# Patient Record
Sex: Female | Born: 1955 | Race: White | Hispanic: No | Marital: Married | Smoking: Current some day smoker
Health system: Southern US, Community
[De-identification: ages and names within clinical notes are randomized; demographics above are authoritative.]

## PROBLEM LIST (undated history)

## (undated) DIAGNOSIS — R000 Tachycardia, unspecified: Secondary | ICD-10-CM

## (undated) DIAGNOSIS — G4730 Sleep apnea, unspecified: Secondary | ICD-10-CM

## (undated) DIAGNOSIS — J449 Chronic obstructive pulmonary disease, unspecified: Secondary | ICD-10-CM

## (undated) DIAGNOSIS — E0500 Thyrotoxicosis with diffuse goiter without thyrotoxic crisis or storm: Secondary | ICD-10-CM

## (undated) DIAGNOSIS — I10 Essential (primary) hypertension: Secondary | ICD-10-CM

## (undated) DIAGNOSIS — K219 Gastro-esophageal reflux disease without esophagitis: Secondary | ICD-10-CM

## (undated) HISTORY — PX: FOOT SURGERY: SHX648

## (undated) HISTORY — PX: BACK SURGERY: SHX140

## (undated) HISTORY — PX: ABDOMINAL HYSTERECTOMY: SHX81

## (undated) HISTORY — PX: THYROIDECTOMY: SHX17

## (undated) HISTORY — PX: TUBAL LIGATION: SHX77

## (undated) HISTORY — PX: COLON RESECTION: SHX5231

---

## 2006-03-26 ENCOUNTER — Ambulatory Visit: Payer: Self-pay | Admitting: Physician Assistant

## 2006-05-18 ENCOUNTER — Other Ambulatory Visit: Payer: Self-pay

## 2006-05-24 ENCOUNTER — Inpatient Hospital Stay: Payer: Self-pay | Admitting: Unknown Physician Specialty

## 2009-12-02 ENCOUNTER — Emergency Department: Payer: Self-pay | Admitting: Emergency Medicine

## 2010-11-12 ENCOUNTER — Ambulatory Visit: Payer: Self-pay | Admitting: Family Medicine

## 2012-05-18 IMAGING — NM NM THYROID IMAGING W/ UPTAKE SINGLE (24 HR)
1 series · 3 of 3 positions shown · non-contrast
Comparison: none

REASON FOR EXAM: Low TSH Eval Graves Disease
COMMENTS:

[Series 1000: (id) thyroid scan · 2.40mm/px · 3 of 3 slices shown]
[im 1/3]
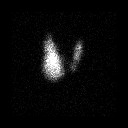
[im 2/3]
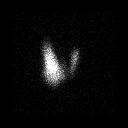
[im 3/3]
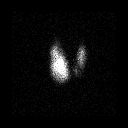

[3 of 3 positions shown; findings below may reference images not displayed]

PROCEDURE:     KNM - KNM THYROID 3-P0A 24HR [DATE]  [DATE]

RESULT:     Following oral administration 152 uCi radioactive Iodine 3-P0A,
the 5 hour thyroid uptake measures 21.4% and the 23 hour uptake measures
42.8%. These values are in the hyperthyroid range.

Thyroid scan was performed in the anterior and both oblique views. The
thyroid lobes are asymmetrical with the left lobe appearing small. No hot or
cold nodules are identified in either lobe.
IMPRESSION: 1. Thyroid uptake values are in the hyperthyroid range as noted above.
2. The thyroid lobes are asymmetrical in size with the left lobe appearing
small and atrophic as compared to the right.
3. No hot or cold nodules are identified.

## 2017-03-08 ENCOUNTER — Ambulatory Visit: Payer: Self-pay | Attending: Oncology | Admitting: *Deleted

## 2017-03-08 ENCOUNTER — Encounter: Payer: Self-pay | Admitting: *Deleted

## 2017-03-08 ENCOUNTER — Ambulatory Visit
Admission: RE | Admit: 2017-03-08 | Discharge: 2017-03-08 | Disposition: A | Discharge status: Home / Self Care | Payer: Self-pay | Source: Ambulatory Visit | Attending: Oncology | Admitting: Oncology

## 2017-03-08 VITALS — BP 147/91 | HR 60 | Temp 98.6°F | Ht 66.0 in | Wt 193.0 lb

## 2017-03-08 DIAGNOSIS — Z0000 Encounter for general adult medical examination without abnormal findings: Secondary | ICD-10-CM

## 2017-03-08 NOTE — Patient Instructions (Signed)

## 2017-03-08 NOTE — Progress Notes (Signed)
Subjective:     Patient ID: Wanda Morgan, female   DOB: 10/23/1955, 61 y.o.   MRN: [id number]  HPI   Review of Systems     Objective:   Physical Exam  Pulmonary/Chest: Right breast exhibits no inverted nipple, no mass, no nipple discharge, no skin change and no tenderness. Left breast exhibits no inverted nipple, no mass, no nipple discharge, no skin change and no tenderness. Breasts are symmetrical.       Assessment:     61 year old White female referred to [facility] by Rolm GalaHeidi Grandis, MD from [facility].  Patient states she has not had a mammogram in 40 years.  States she had one at age 61 for non-diagnostic purposes.  Clinical breast exam unremarkable.  Taught self breast awareness.  History of a total hysterectomy at age 61 for heavy bleeding.  Blood pressure elevated at 147/91.  She is to recheck her blood pressure at [facility] or [facility], and if remains higher than 140/90 she is to follow-up with her primary care provider.  Patient states "I have agoraphobia, and I have anxiety attacks."  Hand out on hypertention given to patient.  Patient has been screened for eligibility.  She does not have any insurance, Medicare or Medicaid.  She also meets financial eligibility.  Hand-out given on the Affordable Care Act.    Plan:     Screening mammogram ordered.  Will follow-up per [facility] protocol.

## 2017-03-09 ENCOUNTER — Encounter: Payer: Self-pay | Admitting: *Deleted

## 2017-03-09 NOTE — Progress Notes (Signed)
Letter mailed from the [facility] to inform patient of her normal mammogram results.  Patient is to follow-up with annual screening in one year.  HSIS to Christy. 

## 2020-09-30 ENCOUNTER — Encounter: Payer: Self-pay | Admitting: Ophthalmology

## 2020-10-03 ENCOUNTER — Other Ambulatory Visit
Admission: RE | Admit: 2020-10-03 | Discharge: 2020-10-03 | Disposition: A | Discharge status: Home / Self Care | Payer: Medicare (Managed Care) | Source: Ambulatory Visit | Attending: Ophthalmology | Admitting: Ophthalmology

## 2020-10-03 ENCOUNTER — Other Ambulatory Visit: Payer: Self-pay

## 2020-10-03 DIAGNOSIS — Z20822 Contact with and (suspected) exposure to covid-19: Secondary | ICD-10-CM | POA: Insufficient documentation

## 2020-10-03 DIAGNOSIS — Z01812 Encounter for preprocedural laboratory examination: Secondary | ICD-10-CM | POA: Diagnosis present

## 2020-10-03 NOTE — Discharge Instructions (Signed)

## 2020-10-04 LAB — SARS CORONAVIRUS 2 (TAT 6-24 HRS): SARS Coronavirus 2: NEGATIVE

## 2020-10-07 ENCOUNTER — Encounter: Payer: Self-pay | Admitting: Ophthalmology

## 2020-10-07 ENCOUNTER — Other Ambulatory Visit: Payer: Self-pay

## 2020-10-07 ENCOUNTER — Encounter
Admission: RE | Disposition: A | Discharge status: Home / Self Care | Payer: Self-pay | Source: Ambulatory Visit | Attending: Ophthalmology

## 2020-10-07 ENCOUNTER — Ambulatory Visit
Admission: RE | Admit: 2020-10-07 | Discharge: 2020-10-07 | Disposition: A | Discharge status: Home / Self Care | Payer: Medicare (Managed Care) | Source: Ambulatory Visit | Attending: Ophthalmology | Admitting: Ophthalmology

## 2020-10-07 ENCOUNTER — Ambulatory Visit: Payer: Medicare (Managed Care) | Admitting: Anesthesiology

## 2020-10-07 DIAGNOSIS — F17200 Nicotine dependence, unspecified, uncomplicated: Secondary | ICD-10-CM | POA: Diagnosis not present

## 2020-10-07 DIAGNOSIS — H2512 Age-related nuclear cataract, left eye: Secondary | ICD-10-CM | POA: Diagnosis not present

## 2020-10-07 DIAGNOSIS — Z888 Allergy status to other drugs, medicaments and biological substances status: Secondary | ICD-10-CM | POA: Insufficient documentation

## 2020-10-07 DIAGNOSIS — Z79890 Hormone replacement therapy (postmenopausal): Secondary | ICD-10-CM | POA: Insufficient documentation

## 2020-10-07 DIAGNOSIS — Z79899 Other long term (current) drug therapy: Secondary | ICD-10-CM | POA: Insufficient documentation

## 2020-10-07 HISTORY — PX: CATARACT EXTRACTION W/PHACO: SHX586

## 2020-10-07 HISTORY — DX: Tachycardia, unspecified: R00.0

## 2020-10-07 HISTORY — DX: Essential (primary) hypertension: I10

## 2020-10-07 HISTORY — DX: Sleep apnea, unspecified: G47.30

## 2020-10-07 HISTORY — DX: Gastro-esophageal reflux disease without esophagitis: K21.9

## 2020-10-07 HISTORY — DX: Thyrotoxicosis with diffuse goiter without thyrotoxic crisis or storm: E05.00

## 2020-10-07 HISTORY — DX: Chronic obstructive pulmonary disease, unspecified: J44.9

## 2020-10-07 SURGERY — PHACOEMULSIFICATION, CATARACT, WITH IOL INSERTION
Anesthesia: Monitor Anesthesia Care | Site: Eye | Laterality: Left

## 2020-10-07 MED ORDER — MOXIFLOXACIN HCL 0.5 % OP SOLN
OPHTHALMIC | Status: DC | PRN
  Administered 2020-10-07: 0.2 mL via OPHTHALMIC

## 2020-10-07 MED ORDER — EPINEPHRINE PF 1 MG/ML IJ SOLN
INTRAOCULAR | Status: DC | PRN
  Administered 2020-10-07: 55 mL via OPHTHALMIC

## 2020-10-07 MED ORDER — ACETAMINOPHEN 325 MG PO TABS: 325.0000 mg | ORAL_TABLET | Freq: Once | ORAL | Status: DC

## 2020-10-07 MED ORDER — LACTATED RINGERS IV SOLN: INTRAVENOUS | Status: DC

## 2020-10-07 MED ORDER — SODIUM HYALURONATE 23 MG/ML IO SOLN
INTRAOCULAR | Status: DC | PRN
  Administered 2020-10-07: 0.6 mL via INTRAOCULAR

## 2020-10-07 MED ORDER — TETRACAINE HCL 0.5 % OP SOLN
1.0000 [drp] | OPHTHALMIC | Status: DC | PRN
  Administered 2020-10-07 (×3): 1 [drp] via OPHTHALMIC

## 2020-10-07 MED ORDER — ACETAMINOPHEN 160 MG/5ML PO SOLN: 325.0000 mg | Freq: Once | ORAL | Status: DC

## 2020-10-07 MED ORDER — MIDAZOLAM HCL 2 MG/2ML IJ SOLN
INTRAMUSCULAR | Status: DC | PRN
  Administered 2020-10-07: 2 mg via INTRAVENOUS

## 2020-10-07 MED ORDER — ARMC OPHTHALMIC DILATING DROPS
1.0000 "application " | OPHTHALMIC | Status: DC | PRN
  Administered 2020-10-07 (×3): 1 via OPHTHALMIC

## 2020-10-07 MED ORDER — LIDOCAINE HCL (PF) 2 % IJ SOLN
INTRAOCULAR | Status: DC | PRN
  Administered 2020-10-07: 1 mL via INTRAOCULAR

## 2020-10-07 MED ORDER — SODIUM HYALURONATE 10 MG/ML IO SOLN
INTRAOCULAR | Status: DC | PRN
  Administered 2020-10-07: 0.55 mL via INTRAOCULAR

## 2020-10-07 MED ORDER — FENTANYL CITRATE (PF) 100 MCG/2ML IJ SOLN
INTRAMUSCULAR | Status: DC | PRN
  Administered 2020-10-07: 100 ug via INTRAVENOUS

## 2020-10-07 SURGICAL SUPPLY — 19 items
CANNULA ANT/CHMB 27G (MISCELLANEOUS) ×2 IMPLANT
CANNULA ANT/CHMB 27GA (MISCELLANEOUS) ×4 IMPLANT
DISSECTOR HYDRO NUCLEUS 50X22 (MISCELLANEOUS) ×2 IMPLANT
GLOVE SURG LX 7.5 STRW (GLOVE) ×1
GLOVE SURG LX STRL 7.5 STRW (GLOVE) ×1 IMPLANT
GLOVE SURG SYN 8.5  E (GLOVE) ×1
GLOVE SURG SYN 8.5 E (GLOVE) ×1 IMPLANT
GLOVE SURG SYN 8.5 PF PI (GLOVE) ×1 IMPLANT
GOWN STRL REUS W/ TWL LRG LVL3 (GOWN DISPOSABLE) ×2 IMPLANT
GOWN STRL REUS W/TWL LRG LVL3 (GOWN DISPOSABLE) ×4
LENS IOL TECNIS EYHANCE 14.0 (Intraocular Lens) ×1 IMPLANT
MARKER SKIN DUAL TIP RULER LAB (MISCELLANEOUS) ×2 IMPLANT
PACK DR. KING ARMS (PACKS) ×2 IMPLANT
PACK EYE AFTER SURG (MISCELLANEOUS) ×2 IMPLANT
PACK OPTHALMIC (MISCELLANEOUS) ×2 IMPLANT
SYR 3ML LL SCALE MARK (SYRINGE) ×2 IMPLANT
SYR TB 1ML LUER SLIP (SYRINGE) ×2 IMPLANT
WATER STERILE IRR 250ML POUR (IV SOLUTION) ×2 IMPLANT
WIPE NON LINTING 3.25X3.25 (MISCELLANEOUS) ×2 IMPLANT

## 2020-10-07 NOTE — Anesthesia Preprocedure Evaluation (Signed)
Anesthesia Evaluation  Patient identified by MRN, date of birth, ID band Patient awake    Reviewed: Allergy & Precautions, H&P , NPO status , Patient's Chart, lab work & pertinent test results  Airway Mallampati: II  TM Distance: >3 FB Neck ROM: full    Dental no notable dental hx.    Pulmonary sleep apnea , COPD, Current Smoker and Patient abstained from smoking.,    Pulmonary exam normal breath sounds clear to auscultation       Cardiovascular hypertension, Normal cardiovascular exam Rhythm:regular Rate:Normal     Neuro/Psych Anxiety    GI/Hepatic GERD  ,  Endo/Other    Renal/GU      Musculoskeletal   Abdominal   Peds  Hematology   Anesthesia Other Findings   Reproductive/Obstetrics                             Anesthesia Physical Anesthesia Plan  ASA: III  Anesthesia Plan: MAC   Post-op Pain Management:    Induction:   PONV Risk Score and Plan: 2 and Treatment may vary due to age or medical condition, TIVA and Midazolam  Airway Management Planned:   Additional Equipment:   Intra-op Plan:   Post-operative Plan:   Informed Consent: I have reviewed the patients History and Physical, chart, labs and discussed the procedure including the risks, benefits and alternatives for the proposed anesthesia with the patient or authorized representative who has indicated his/her understanding and acceptance.     Dental Advisory Given  Plan Discussed with: CRNA  Anesthesia Plan Comments:         Anesthesia Quick Evaluation

## 2020-10-07 NOTE — Op Note (Signed)
OPERATIVE NOTE  RHEDA KASSAB [id number] 10/07/2020   PREOPERATIVE DIAGNOSIS:  Nuclear sclerotic cataract left eye.  H25.12   POSTOPERATIVE DIAGNOSIS:    Nuclear sclerotic cataract left eye.     PROCEDURE:  Phacoemusification with posterior chamber intraocular lens placement of the left eye   LENS:   Implant Name Type Inv. Item Serial No. Manufacturer Lot No. LRB No. Used Action  LENS IOL TECNIS EYHANCE 14.0 - [id number] Intraocular Lens LENS IOL TECNIS EYHANCE 14.0 [id number] JOHNSON   Left 1 Implanted      Procedure(s): CATARACT EXTRACTION PHACO AND INTRAOCULAR LENS PLACEMENT (IOC) LEFT 4.66 00:36.4 (Left)  DIB00 +14.0   ULTRASOUND TIME: 0 minutes 36 seconds.  CDE 4.66   SURGEON:  Willey Blade, MD, MPH   ANESTHESIA:  Topical with tetracaine drops augmented with 1% preservative-free intracameral lidocaine.  ESTIMATED BLOOD LOSS: <1 mL   COMPLICATIONS:  None.   DESCRIPTION OF PROCEDURE:  The patient was identified in the holding room and transported to the operating room and placed in the supine position under the operating microscope.  The left eye was identified as the operative eye and it was prepped and draped in the usual sterile ophthalmic fashion.   A 1.0 millimeter clear-corneal paracentesis was made at the 5:00 position. 0.5 ml of preservative-free 1% lidocaine with epinephrine was injected into the anterior chamber.  The anterior chamber was filled with Healon 5 viscoelastic.  A 2.4 millimeter keratome was used to make a near-clear corneal incision at the 2:00 position.  A curvilinear capsulorrhexis was made with a cystotome and capsulorrhexis forceps.  Balanced salt solution was used to hydrodissect and hydrodelineate the nucleus.   Phacoemulsification was then used in stop and chop fashion to remove the lens nucleus and epinucleus.  The remaining cortex was then removed using the irrigation and aspiration handpiece. Healon was then placed into the capsular bag to  distend it for lens placement.  A lens was then injected into the capsular bag.  The remaining viscoelastic was aspirated.   Wounds were hydrated with balanced salt solution.  The anterior chamber was inflated to a physiologic pressure with balanced salt solution.  Intracameral vigamox 0.1 mL undiltued was injected into the eye and a drop placed onto the ocular surface.  No wound leaks were noted.  The patient was taken to the recovery room in stable condition without complications of anesthesia or surgery  Willey Blade 10/07/2020, 10:03 AM

## 2020-10-07 NOTE — H&P (Signed)
[facility]   Primary Care Physician:  Rolm Gala, MD Ophthalmologist: Dr. Willey Blade  Pre-Procedure History & Physical: HPI:  Wanda Morgan is a 65 y.o. female here for cataract surgery.   Past Medical History:  Diagnosis Date  . COPD (chronic obstructive pulmonary disease) (HCC)   . GERD (gastroesophageal reflux disease)   . Graves disease   . Hypertension   . Sleep apnea   . Tachycardia     Past Surgical History:  Procedure Laterality Date  . ABDOMINAL HYSTERECTOMY    . BACK SURGERY    . COLON RESECTION    . FOOT SURGERY    . THYROIDECTOMY    . TUBAL LIGATION      Prior to Admission medications   Medication Sig Start Date End Date Taking? Authorizing Provider  amLODipine (NORVASC) 5 MG tablet Take 5 mg by mouth daily.   Yes [provider]  Ascorbic Acid (VITAMIN C) 100 MG tablet Take 100 mg by mouth daily.   Yes [provider]  Ginger, Zingiber officinalis, (GINGER ROOT PO) Take by mouth.   Yes [provider]  levothyroxine (SYNTHROID) 100 MCG tablet Take 100 mcg by mouth daily before breakfast.   Yes [provider]  losartan (COZAAR) 100 MG tablet Take 100 mg by mouth daily.   Yes [provider]  metoprolol succinate (TOPROL-XL) 50 MG 24 hr tablet Take 50 mg by mouth daily. Take with or immediately following a meal.   Yes [provider]  omeprazole (PRILOSEC) 40 MG capsule Take 40 mg by mouth daily.   Yes [provider]    Allergies as of 08/14/2020 - never reviewed  Allergen Reaction Noted  . Lisinopril Cough 02/08/2017    Family History  Problem Relation Age of Onset  . Breast cancer Neg Hx     Social History   Socioeconomic History  . Marital status: Married    Spouse name: Not on file  . Number of children: Not on file  . Years of education: Not on file  . Highest education level: Not on file  Occupational History  . Not on file  Tobacco Use  . Smoking status:  Current Some Day Smoker  . Smokeless tobacco: Not on file  Substance and Sexual Activity  . Alcohol use: Yes    Alcohol/week: 18.0 standard drinks    Types: 18 Cans of beer per week  . Drug use: Not on file  . Sexual activity: Not on file  Other Topics Concern  . Not on file  Social History Narrative  . Not on file   Social Determinants of Health   Financial Resource Strain: Not on file  Food Insecurity: Not on file  Transportation Needs: Not on file  Physical Activity: Not on file  Stress: Not on file  Social Connections: Not on file  Intimate Partner Violence: Not on file    Review of Systems: See HPI, otherwise negative ROS  Physical Exam: BP (!) 157/87   Pulse (!) 59   Temp (!) 97.4 F (36.3 C) (Temporal)   Resp 18   Ht 5' 4.5" (1.638 m)   Wt 82.2 kg   SpO2 98%   BMI 30.64 kg/m  General:   Alert,  pleasant and cooperative in NAD Head:  Normocephalic and atraumatic. Respiratory:  Normal work of breathing.  Impression/Plan: Wanda Morgan is here for cataract surgery.  Risks, benefits, limitations, and alternatives regarding cataract surgery have been reviewed with the patient.  Questions have been answered.  All parties agreeable.   Willey Blade, MD  10/07/2020, 9:36 AM

## 2020-10-07 NOTE — Anesthesia Postprocedure Evaluation (Signed)
Anesthesia Post Note  Patient: Wanda Morgan  Procedure(s) Performed: CATARACT EXTRACTION PHACO AND INTRAOCULAR LENS PLACEMENT (IOC) LEFT 4.66 00:36.4 (Left Eye)     Patient location during evaluation: PACU Anesthesia Type: MAC Level of consciousness: awake and alert and oriented Pain management: satisfactory to patient Vital Signs Assessment: post-procedure vital signs reviewed and stable Respiratory status: spontaneous breathing, nonlabored ventilation and respiratory function stable Cardiovascular status: blood pressure returned to baseline and stable Postop Assessment: Adequate PO intake and No signs of nausea or vomiting Anesthetic complications: no   No complications documented.  Raliegh Ip

## 2020-10-07 NOTE — Anesthesia Procedure Notes (Signed)
Procedure Name: MAC Date/Time: 10/07/2020 9:46 AM Performed by: Silvana Newness, CRNA Pre-anesthesia Checklist: Patient identified, Emergency Drugs available, Suction available, Patient being monitored and Timeout performed Patient Re-evaluated:Patient Re-evaluated prior to induction Oxygen Delivery Method: Nasal cannula Placement Confirmation: positive ETCO2

## 2020-10-07 NOTE — Transfer of Care (Signed)
Immediate Anesthesia Transfer of Care Note  Patient: Wanda Morgan  Procedure(s) Performed: CATARACT EXTRACTION PHACO AND INTRAOCULAR LENS PLACEMENT (IOC) LEFT 4.66 00:36.4 (Left Eye)  Patient Location: PACU  Anesthesia Type: MAC  Level of Consciousness: awake, alert  and patient cooperative  Airway and Oxygen Therapy: Patient Spontanous Breathing and Patient connected to supplemental oxygen  Post-op Assessment: Post-op Vital signs reviewed, Patient's Cardiovascular Status Stable, Respiratory Function Stable, Patent Airway and No signs of Nausea or vomiting  Post-op Vital Signs: Reviewed and stable  Complications: No complications documented.

## 2020-10-08 ENCOUNTER — Encounter: Payer: Self-pay | Admitting: Ophthalmology

## 2020-10-10 ENCOUNTER — Encounter: Payer: Self-pay | Admitting: Ophthalmology

## 2020-10-16 NOTE — Discharge Instructions (Signed)

## 2020-10-17 ENCOUNTER — Other Ambulatory Visit
Admission: RE | Admit: 2020-10-17 | Discharge: 2020-10-17 | Disposition: A | Discharge status: Home / Self Care | Payer: Medicare (Managed Care) | Source: Ambulatory Visit | Attending: Ophthalmology | Admitting: Ophthalmology

## 2020-10-17 ENCOUNTER — Other Ambulatory Visit: Payer: Self-pay

## 2020-10-17 DIAGNOSIS — Z20822 Contact with and (suspected) exposure to covid-19: Secondary | ICD-10-CM | POA: Diagnosis not present

## 2020-10-17 DIAGNOSIS — Z01812 Encounter for preprocedural laboratory examination: Secondary | ICD-10-CM | POA: Diagnosis present

## 2020-10-17 LAB — SARS CORONAVIRUS 2 (TAT 6-24 HRS): SARS Coronavirus 2: NEGATIVE

## 2020-10-21 ENCOUNTER — Ambulatory Visit
Admission: RE | Admit: 2020-10-21 | Discharge: 2020-10-21 | Disposition: A | Discharge status: Home / Self Care | Payer: Medicare (Managed Care) | Source: Ambulatory Visit | Attending: Ophthalmology | Admitting: Ophthalmology

## 2020-10-21 ENCOUNTER — Encounter
Admission: RE | Disposition: A | Discharge status: Home / Self Care | Payer: Self-pay | Source: Ambulatory Visit | Attending: Ophthalmology

## 2020-10-21 ENCOUNTER — Other Ambulatory Visit: Payer: Self-pay

## 2020-10-21 ENCOUNTER — Ambulatory Visit: Payer: Medicare (Managed Care) | Admitting: Anesthesiology

## 2020-10-21 ENCOUNTER — Encounter: Payer: Self-pay | Admitting: Ophthalmology

## 2020-10-21 DIAGNOSIS — Z888 Allergy status to other drugs, medicaments and biological substances status: Secondary | ICD-10-CM | POA: Insufficient documentation

## 2020-10-21 DIAGNOSIS — H2511 Age-related nuclear cataract, right eye: Secondary | ICD-10-CM | POA: Diagnosis not present

## 2020-10-21 DIAGNOSIS — Z79899 Other long term (current) drug therapy: Secondary | ICD-10-CM | POA: Insufficient documentation

## 2020-10-21 DIAGNOSIS — Z79890 Hormone replacement therapy (postmenopausal): Secondary | ICD-10-CM | POA: Diagnosis not present

## 2020-10-21 DIAGNOSIS — F17200 Nicotine dependence, unspecified, uncomplicated: Secondary | ICD-10-CM | POA: Diagnosis not present

## 2020-10-21 HISTORY — PX: CATARACT EXTRACTION W/PHACO: SHX586

## 2020-10-21 SURGERY — PHACOEMULSIFICATION, CATARACT, WITH IOL INSERTION
Anesthesia: Monitor Anesthesia Care | Site: Eye | Laterality: Right

## 2020-10-21 MED ORDER — SODIUM HYALURONATE 23 MG/ML IO SOLN
INTRAOCULAR | Status: DC | PRN
  Administered 2020-10-21: 0.6 mL via INTRAOCULAR

## 2020-10-21 MED ORDER — LIDOCAINE HCL (PF) 2 % IJ SOLN
INTRAOCULAR | Status: DC | PRN
  Administered 2020-10-21: 1 mL via INTRAOCULAR

## 2020-10-21 MED ORDER — EPINEPHRINE PF 1 MG/ML IJ SOLN
INTRAOCULAR | Status: DC | PRN
  Administered 2020-10-21: 64 mL via OPHTHALMIC

## 2020-10-21 MED ORDER — MOXIFLOXACIN HCL 0.5 % OP SOLN
OPHTHALMIC | Status: DC | PRN
  Administered 2020-10-21: 0.2 mL via OPHTHALMIC

## 2020-10-21 MED ORDER — TETRACAINE HCL 0.5 % OP SOLN
1.0000 [drp] | OPHTHALMIC | Status: DC | PRN
  Administered 2020-10-21 (×3): 1 [drp] via OPHTHALMIC

## 2020-10-21 MED ORDER — ACETAMINOPHEN 160 MG/5ML PO SOLN: 325.0000 mg | Freq: Once | ORAL | Status: DC

## 2020-10-21 MED ORDER — ARMC OPHTHALMIC DILATING DROPS
1.0000 "application " | OPHTHALMIC | Status: DC | PRN
  Administered 2020-10-21 (×3): 1 via OPHTHALMIC

## 2020-10-21 MED ORDER — ACETAMINOPHEN 325 MG PO TABS: 325.0000 mg | ORAL_TABLET | Freq: Once | ORAL | Status: DC

## 2020-10-21 MED ORDER — MIDAZOLAM HCL 2 MG/2ML IJ SOLN
INTRAMUSCULAR | Status: DC | PRN
  Administered 2020-10-21 (×2): 1 mg via INTRAVENOUS

## 2020-10-21 MED ORDER — LACTATED RINGERS IV SOLN: INTRAVENOUS | Status: DC

## 2020-10-21 MED ORDER — SODIUM HYALURONATE 10 MG/ML IO SOLN
INTRAOCULAR | Status: DC | PRN
  Administered 2020-10-21: 0.55 mL via INTRAOCULAR

## 2020-10-21 MED ORDER — FENTANYL CITRATE (PF) 100 MCG/2ML IJ SOLN
INTRAMUSCULAR | Status: DC | PRN
  Administered 2020-10-21 (×2): 50 ug via INTRAVENOUS

## 2020-10-21 SURGICAL SUPPLY — 19 items

## 2020-10-21 NOTE — Anesthesia Procedure Notes (Signed)
Procedure Name: MAC Date/Time: 10/21/2020 8:03 AM Performed by: Cameron Ali, CRNA Pre-anesthesia Checklist: Patient identified, Emergency Drugs available, Suction available, Timeout performed and Patient being monitored Patient Re-evaluated:Patient Re-evaluated prior to induction Oxygen Delivery Method: Nasal cannula Placement Confirmation: positive ETCO2

## 2020-10-21 NOTE — Anesthesia Preprocedure Evaluation (Signed)
Anesthesia Evaluation  Patient identified by MRN, date of birth, ID band Patient awake    Reviewed: Allergy & Precautions, H&P , NPO status , Patient's Chart, lab work & pertinent test results  Airway Mallampati: II  TM Distance: >3 FB Neck ROM: full    Dental no notable dental hx.    Pulmonary sleep apnea , COPD, Current Smoker and Patient abstained from smoking.,    Pulmonary exam normal breath sounds clear to auscultation       Cardiovascular hypertension, Normal cardiovascular exam Rhythm:regular Rate:Normal     Neuro/Psych Anxiety    GI/Hepatic GERD  ,  Endo/Other    Renal/GU      Musculoskeletal   Abdominal   Peds  Hematology   Anesthesia Other Findings   Reproductive/Obstetrics                             Anesthesia Physical  Anesthesia Plan  ASA: II  Anesthesia Plan: MAC   Post-op Pain Management:    Induction:   PONV Risk Score and Plan: 2 and Treatment may vary due to age or medical condition, Midazolam and TIVA  Airway Management Planned:   Additional Equipment:   Intra-op Plan:   Post-operative Plan:   Informed Consent: I have reviewed the patients History and Physical, chart, labs and discussed the procedure including the risks, benefits and alternatives for the proposed anesthesia with the patient or authorized representative who has indicated his/her understanding and acceptance.     Dental Advisory Given  Plan Discussed with: CRNA  Anesthesia Plan Comments:         Anesthesia Quick Evaluation

## 2020-10-21 NOTE — H&P (Signed)
[facility]   Primary Care Physician:  Rolm Gala, MD Ophthalmologist: Dr. Willey Blade  Pre-Procedure History & Physical: HPI:  Wanda Morgan is a 65 y.o. female here for cataract surgery.   Past Medical History:  Diagnosis Date  . COPD (chronic obstructive pulmonary disease) (HCC)   . GERD (gastroesophageal reflux disease)   . Graves disease   . Hypertension   . Sleep apnea   . Tachycardia     Past Surgical History:  Procedure Laterality Date  . ABDOMINAL HYSTERECTOMY    . BACK SURGERY    . CATARACT EXTRACTION W/PHACO Left 10/07/2020   Procedure: CATARACT EXTRACTION PHACO AND INTRAOCULAR LENS PLACEMENT (IOC) LEFT 4.66 00:36.4;  Surgeon: Nevada Crane, MD;  Location: [facility];  Service: Ophthalmology;  Laterality: Left;  . COLON RESECTION    . FOOT SURGERY    . THYROIDECTOMY    . TUBAL LIGATION      Prior to Admission medications   Medication Sig Start Date End Date Taking? Authorizing Provider  amLODipine (NORVASC) 5 MG tablet Take 5 mg by mouth daily.   Yes [provider]  Ascorbic Acid (VITAMIN C) 100 MG tablet Take 100 mg by mouth daily.   Yes [provider]  Ginger, Zingiber officinalis, (GINGER ROOT PO) Take by mouth.   Yes [provider]  levothyroxine (SYNTHROID) 100 MCG tablet Take 100 mcg by mouth daily before breakfast.   Yes [provider]  losartan (COZAAR) 100 MG tablet Take 100 mg by mouth daily.   Yes [provider]  metoprolol succinate (TOPROL-XL) 50 MG 24 hr tablet Take 50 mg by mouth daily. Take with or immediately following a meal.   Yes [provider]  omeprazole (PRILOSEC) 40 MG capsule Take 40 mg by mouth daily.   Yes [provider]    Allergies as of 08/14/2020 - never reviewed  Allergen Reaction Noted  . Lisinopril Cough 02/08/2017    Family History  Problem Relation Age of Onset  . Breast cancer Neg Hx     Social History   Socioeconomic  History  . Marital status: Married    Spouse name: Not on file  . Number of children: Not on file  . Years of education: Not on file  . Highest education level: Not on file  Occupational History  . Not on file  Tobacco Use  . Smoking status: Current Some Day Smoker  . Smokeless tobacco: Not on file  Substance and Sexual Activity  . Alcohol use: Yes    Alcohol/week: 18.0 standard drinks    Types: 18 Cans of beer per week  . Drug use: Not on file  . Sexual activity: Not on file  Other Topics Concern  . Not on file  Social History Narrative  . Not on file   Social Determinants of Health   Financial Resource Strain: Not on file  Food Insecurity: Not on file  Transportation Needs: Not on file  Physical Activity: Not on file  Stress: Not on file  Social Connections: Not on file  Intimate Partner Violence: Not on file    Review of Systems: See HPI, otherwise negative ROS  Physical Exam: BP (!) 189/83   Pulse 62   Temp 97.6 F (36.4 C) (Temporal)   Resp 18   Ht 5' 4.5" (1.638 m)   Wt 83 kg   SpO2 98%   BMI 30.93 kg/m  General:   Alert,  pleasant and cooperative in NAD  Head:  Normocephalic and atraumatic. Respiratory:  Normal work of breathing.  Impression/Plan: Wanda Morgan is here for cataract surgery.  Risks, benefits, limitations, and alternatives regarding cataract surgery have been reviewed with the patient.  Questions have been answered.  All parties agreeable.   Willey Blade, MD  10/21/2020, 7:54 AM

## 2020-10-21 NOTE — Anesthesia Postprocedure Evaluation (Signed)
Anesthesia Post Note  Patient: Wanda Morgan  Procedure(s) Performed: CATARACT EXTRACTION PHACO AND INTRAOCULAR LENS PLACEMENT (IOC) RIGHT (Right Eye)     Patient location during evaluation: PACU Anesthesia Type: MAC Level of consciousness: awake and alert and oriented Pain management: satisfactory to patient Vital Signs Assessment: post-procedure vital signs reviewed and stable Respiratory status: spontaneous breathing, nonlabored ventilation and respiratory function stable Cardiovascular status: blood pressure returned to baseline and stable Postop Assessment: Adequate PO intake and No signs of nausea or vomiting Anesthetic complications: no   No complications documented.  Raliegh Ip

## 2020-10-21 NOTE — Transfer of Care (Signed)
Immediate Anesthesia Transfer of Care Note  Patient: Wanda Morgan  Procedure(s) Performed: CATARACT EXTRACTION PHACO AND INTRAOCULAR LENS PLACEMENT (IOC) RIGHT (Right Eye)  Patient Location: PACU  Anesthesia Type: MAC  Level of Consciousness: awake, alert  and patient cooperative  Airway and Oxygen Therapy: Patient Spontanous Breathing and Patient connected to supplemental oxygen  Post-op Assessment: Post-op Vital signs reviewed, Patient's Cardiovascular Status Stable, Respiratory Function Stable, Patent Airway and No signs of Nausea or vomiting  Post-op Vital Signs: Reviewed and stable  Complications: No complications documented.

## 2020-10-21 NOTE — Op Note (Signed)
OPERATIVE NOTE  Wanda Morgan [id number] 10/21/2020   PREOPERATIVE DIAGNOSIS:  Nuclear sclerotic cataract right eye.  H25.11   POSTOPERATIVE DIAGNOSIS:    Nuclear sclerotic cataract right eye.     PROCEDURE:  Phacoemusification with posterior chamber intraocular lens placement of the right eye   LENS:   Implant Name Type Inv. Item Serial No. Manufacturer Lot No. LRB No. Used Action  LENS IOL TECNIS EYHANCE 15.0 - [id number] Intraocular Lens LENS IOL TECNIS EYHANCE 15.0 [id number] JOHNSON   Right 1 Implanted       Procedure(s) with comments: CATARACT EXTRACTION PHACO AND INTRAOCULAR LENS PLACEMENT (IOC) RIGHT (Right) - 3.83 0:30.1  DIB00 +15.0   ULTRASOUND TIME: 0 minutes 30 seconds.  CDE 3.83   SURGEON:  Willey Blade, MD, MPH  ANESTHESIOLOGIST: Anesthesiologist: Ranee Gosselin, MD CRNA: Maree Krabbe, CRNA   ANESTHESIA:  Topical with tetracaine drops augmented with 1% preservative-free intracameral lidocaine.  ESTIMATED BLOOD LOSS: less than 1 mL.   COMPLICATIONS:  None.   DESCRIPTION OF PROCEDURE:  The patient was identified in the holding room and transported to the operating room and placed in the supine position under the operating microscope.  The right eye was identified as the operative eye and it was prepped and draped in the usual sterile ophthalmic fashion.   A 1.0 millimeter clear-corneal paracentesis was made at the 10:30 position. 0.5 ml of preservative-free 1% lidocaine with epinephrine was injected into the anterior chamber.  The anterior chamber was filled with Healon 5 viscoelastic.  A 2.4 millimeter keratome was used to make a near-clear corneal incision at the 8:00 position.  A curvilinear capsulorrhexis was made with a cystotome and capsulorrhexis forceps.  Balanced salt solution was used to hydrodissect and hydrodelineate the nucleus.   Phacoemulsification was then used in stop and chop fashion to remove the lens nucleus and epinucleus.  The remaining  cortex was then removed using the irrigation and aspiration handpiece. Healon was then placed into the capsular bag to distend it for lens placement.  A lens was then injected into the capsular bag.  The remaining viscoelastic was aspirated.   Wounds were hydrated with balanced salt solution.  The anterior chamber was inflated to a physiologic pressure with balanced salt solution.   Intracameral vigamox 0.1 mL undiluted was injected into the eye and a drop placed onto the ocular surface.  No wound leaks were noted.  The patient was taken to the recovery room in stable condition without complications of anesthesia or surgery  Willey Blade 10/21/2020, 8:23 AM

## 2020-10-22 ENCOUNTER — Encounter: Payer: Self-pay | Admitting: Ophthalmology
